# Patient Record
Sex: Male | Born: 1950 | Race: White | Hispanic: No | Smoking: Former smoker
Health system: Southern US, Community
[De-identification: ages and names within clinical notes are randomized; demographics above are authoritative.]

## PROBLEM LIST (undated history)

## (undated) DIAGNOSIS — M545 Low back pain, unspecified: Secondary | ICD-10-CM

## (undated) DIAGNOSIS — J309 Allergic rhinitis, unspecified: Secondary | ICD-10-CM

## (undated) DIAGNOSIS — M8580 Other specified disorders of bone density and structure, unspecified site: Secondary | ICD-10-CM

## (undated) DIAGNOSIS — I10 Essential (primary) hypertension: Secondary | ICD-10-CM

## (undated) DIAGNOSIS — Z8249 Family history of ischemic heart disease and other diseases of the circulatory system: Principal | ICD-10-CM

## (undated) HISTORY — DX: Low back pain: M54.5

## (undated) HISTORY — DX: Other specified disorders of bone density and structure, unspecified site: M85.80

## (undated) HISTORY — PX: APPENDECTOMY: SHX54

## (undated) HISTORY — DX: Allergic rhinitis, unspecified: J30.9

## (undated) HISTORY — DX: Family history of ischemic heart disease and other diseases of the circulatory system: Z82.49

## (undated) HISTORY — PX: ANTERIOR FUSION CERVICAL SPINE: SUR626

## (undated) HISTORY — DX: Low back pain, unspecified: M54.50

## (undated) HISTORY — PX: TONSILLECTOMY: SUR1361

## (undated) HISTORY — PX: COLONOSCOPY: SHX174

## (undated) HISTORY — DX: Essential (primary) hypertension: I10

---

## 2002-05-28 ENCOUNTER — Encounter: Payer: Self-pay | Admitting: Neurosurgery

## 2002-05-28 ENCOUNTER — Encounter: Payer: Self-pay | Admitting: General Surgery

## 2002-05-28 ENCOUNTER — Inpatient Hospital Stay (HOSPITAL_COMMUNITY): Admission: EM | Admit: 2002-05-28 | Discharge: 2002-06-01 | Payer: Self-pay

## 2002-05-29 ENCOUNTER — Encounter: Payer: Self-pay | Admitting: General Surgery

## 2002-06-01 ENCOUNTER — Inpatient Hospital Stay (HOSPITAL_COMMUNITY)
Admission: AD | Admit: 2002-06-01 | Discharge: 2002-06-22 | Payer: Self-pay | Admitting: Physical Medicine & Rehabilitation

## 2002-06-26 ENCOUNTER — Encounter
Admission: RE | Admit: 2002-06-26 | Discharge: 2002-09-24 | Payer: Self-pay | Admitting: Physical Medicine & Rehabilitation

## 2002-09-25 ENCOUNTER — Encounter
Admission: RE | Admit: 2002-09-25 | Discharge: 2002-12-24 | Payer: Self-pay | Admitting: Physical Medicine & Rehabilitation

## 2003-10-07 ENCOUNTER — Encounter: Payer: Self-pay | Admitting: Family Medicine

## 2003-10-07 ENCOUNTER — Encounter: Admission: RE | Admit: 2003-10-07 | Discharge: 2003-10-07 | Payer: Self-pay | Admitting: Family Medicine

## 2003-12-13 ENCOUNTER — Encounter: Admission: RE | Admit: 2003-12-13 | Discharge: 2003-12-13 | Payer: Self-pay | Admitting: Family Medicine

## 2004-06-10 IMAGING — RF DG BE W/ AIR HIGH DENSITY
8 series · 8 of 8 positions shown · non-contrast
Comparison: none

CLINICAL DATA: Incomplete flexible sigmoidoscopy. 
 KUB
 In view of today?s sigmoidoscopy, erect view of the abdomen was obtained.  No free air is seen.  The bowel gas pattern is non specific.  
 IMPRESSION
 No free air. 
 HIGH DENSITY BE W/AIR
 A KUB prior to barium enema with air shows some air throughout both large and small bowel gas from this mornings flexible sigmoidoscopy.  No bowel distention is seen. 
 Double contrast barium enema was performed.  There is some retained feces particularly in the right colon.  However no persistent polypoid lesion or constricting lesion is seen.  Reflux into the terminal ileum shows no significant abnormality. 
 Some retained feces but no persistent polypoid lesion or constricting lesion.  Reflux into the terminal ileum shows no abnormality.

[Series 2: run · 1 of 1 slices shown (1 of 8)]
[im 1/1]
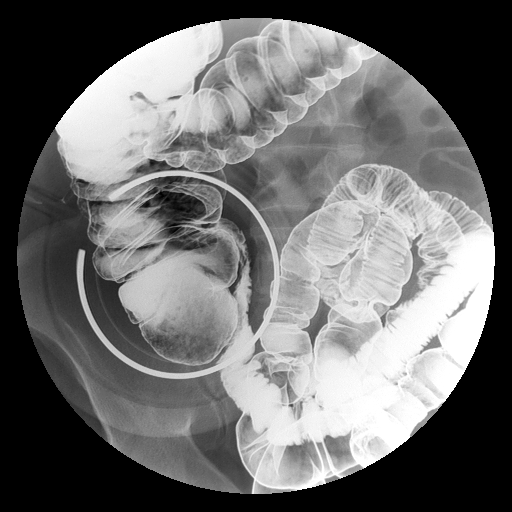

[Series 3: run · 1 of 1 slices shown (2 of 8)]
[im 1/1]
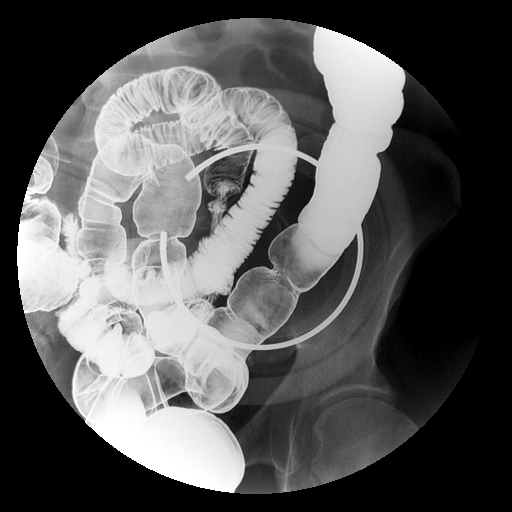

[Series 4: run · 1 of 1 slices shown (3 of 8)]
[im 1/1]
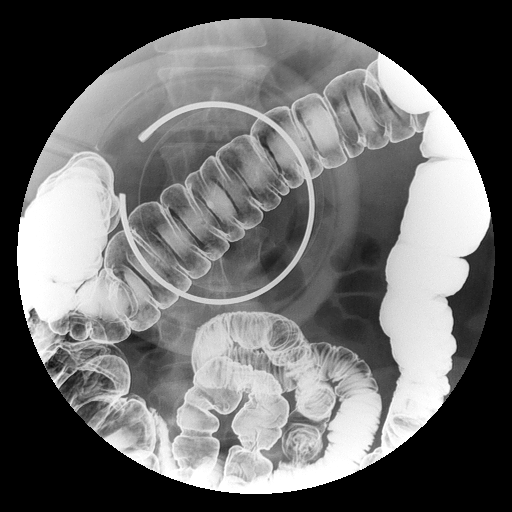

[Series 5: run · 1 of 1 slices shown (4 of 8)]
[im 1/1]
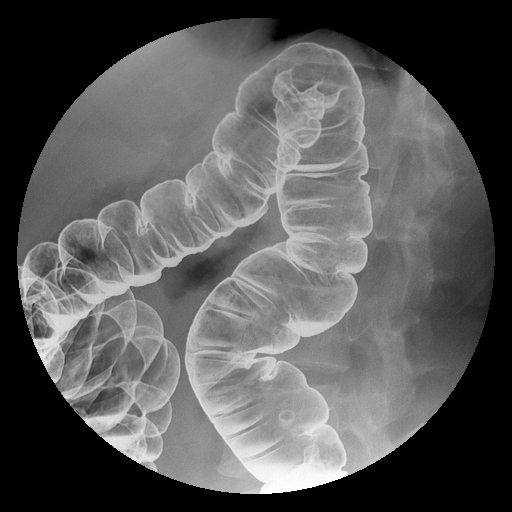

[Series 6: run · 1 of 1 slices shown (5 of 8)]
[im 1/1]
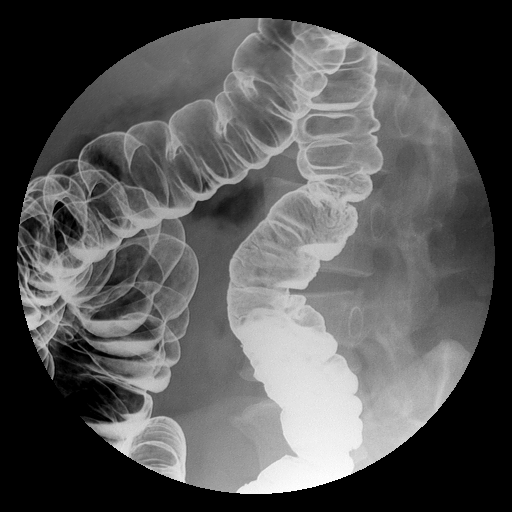

[Series 7: run · 1 of 1 slices shown (6 of 8)]
[im 1/1]
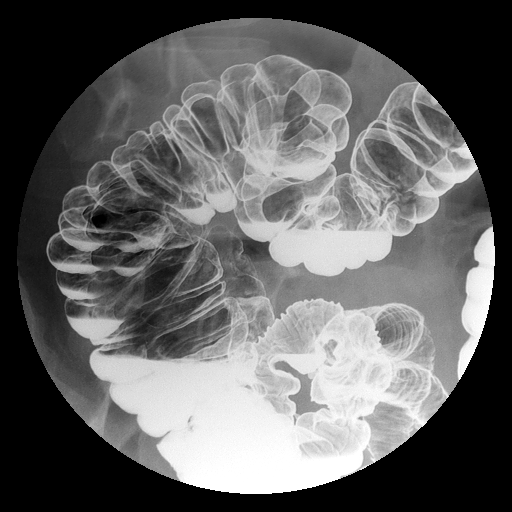

[Series 8: run · 1 of 1 slices shown (7 of 8)]
[im 1/1]
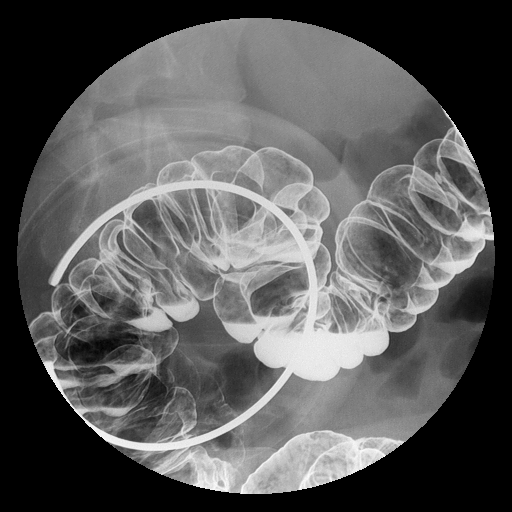

[Series 9: run · 1 of 1 slices shown (8 of 8)]
[im 1/1]
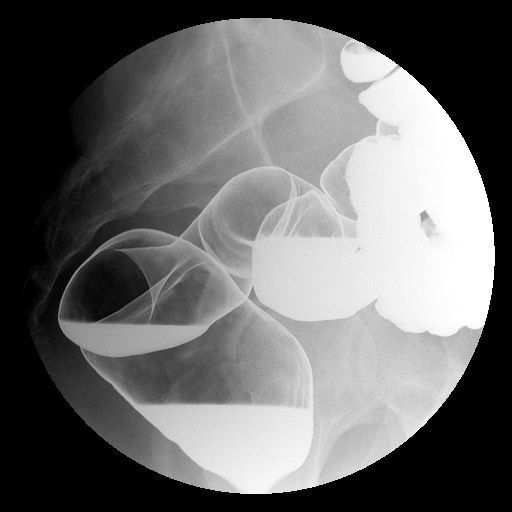

[8 of 8 positions shown; findings below may reference images not displayed]

## 2011-01-16 ENCOUNTER — Encounter: Payer: Self-pay | Admitting: Family Medicine

## 2015-01-30 ENCOUNTER — Encounter: Payer: Self-pay | Admitting: *Deleted

## 2015-01-31 ENCOUNTER — Ambulatory Visit (INDEPENDENT_AMBULATORY_CARE_PROVIDER_SITE_OTHER): Payer: PRIVATE HEALTH INSURANCE | Admitting: Cardiology

## 2015-01-31 ENCOUNTER — Encounter: Payer: Self-pay | Admitting: Cardiology

## 2015-01-31 VITALS — BP 144/100 | HR 59 | Ht 69.0 in | Wt 207.8 lb

## 2015-01-31 DIAGNOSIS — Z8249 Family history of ischemic heart disease and other diseases of the circulatory system: Secondary | ICD-10-CM

## 2015-01-31 HISTORY — DX: Family history of ischemic heart disease and other diseases of the circulatory system: Z82.49

## 2015-01-31 NOTE — Progress Notes (Signed)
Cardiology Office Note   Date:  01/31/2015   ID:  Allen Hardy Bolger, DOB [birth year], MRN [id number]  PCP:  Allean FoundSMITH,CANDACE THIELE, MD  Cardiologist:   Quintella ReichertURNER,TRACI R, MD   Chief Complaint  Patient presents with  . family history of CAD      History of Present Illness: Allen Hardy Goyal is a 64 y.o. male who presents for evaluation of family history of CAD.  He has a history of HTN and has been on ACE I therapy and BP has normalized.  He exercises 30 minutes 3 times weekly.  He has a family history of CAD with his brother having an MI at age 763.  His father died at 6263 of an MI and was a heavy smoker and drinker.  He is now referred for evaluation of CAD.   He denies any chest pain or pressure, SOB, DOE, PND, orthopnea, LE edema, dizziness ( unless he stands up too fast), palpitations or syncope.      Past Medical History  Diagnosis Date  . Low back pain   . Rhinitis, allergic   . Hypertension, benign   . Osteopenia     Past Surgical History  Procedure Laterality Date  . Appendectomy    . Tonsillectomy    . Anterior fusion cervical spine    . Colonoscopy       Current Outpatient Prescriptions  Medication Sig Dispense Refill  . aspirin 81 MG tablet Take 81 mg by mouth daily.    Marland Kitchen. lisinopril (PRINIVIL,ZESTRIL) 10 MG tablet Take 10 mg by mouth daily.    . Misc Natural Products (APPLE CIDER VINEGAR DIET PO) Take by mouth.    . Multiple Vitamins-Minerals (MULTIVITAL PO) Take 1 capsule by mouth daily.    . vitamin Hardy 1000 UNIT capsule Take 1,000 Units by mouth daily.     No current facility-administered medications for this visit.    Allergies:   Review of patient's allergies indicates no known allergies.    Social History:  The patient  reports that he has quit smoking. He does not have any smokeless tobacco history on file. He reports that he drinks alcohol. He reports that he does not use illicit drugs.   Family History:  The patient's family history includes Asthma in  his son; COPD in his sister; Cancer in his mother; Coronary artery disease in his brother; Diabetes in his daughter and mother; Hyperlipidemia in his father; Hypertension in his father; Stroke in his sister.    ROS:  Please see the history of present illness.   Otherwise, review of systems are positive for none.   All other systems are reviewed and negative.    PHYSICAL EXAM: VS:  BP 144/100 mmHg  Pulse 59  Ht 5\' 9"  (1.753 m)  Wt 207 lb 12.8 oz (94.257 kg)  BMI 30.67 kg/m2 , BMI Body mass index is 30.67 kg/(m^2). GEN: Well nourished, well developed, in no acute distress HEENT: normal Neck: no JVD, carotid bruits, or masses Cardiac: RRR; no murmurs, rubs, or gallops,no edema  Respiratory:  clear to auscultation bilaterally, normal work of breathing GI: soft, nontender, nondistended, + BS MS: no deformity or atrophy Skin: warm and dry, no rash Neuro:  Strength and sensation are intact Psych: euthymic mood, full affect   EKG:  EKG is ordered today. The ekg ordered today demonstrates sinus bradycardia at 59bpm with isolated T wave inversion in aVL.     Recent Labs: No results found for requested labs within last  365 days.    Lipid Panel No results found for: CHOL, TRIG, HDL, CHOLHDL, VLDL, LDLCALC, LDLDIRECT    Wt Readings from Last 3 Encounters:  01/31/15 207 lb 12.8 oz (94.257 kg)      ASSESSMENT AND PLAN:  1.  Family history of CAD - he is completely asymptomatic and exercises 3 times weekly with no problems.  His CRF include male sex, age >29, family history of CAD at early age and HTN.     Current medicines are reviewed at length with the patient today.  The patient does not have concerns regarding medicines.  The following changes have been made:  no change  Labs/ tests ordered today include: ETT     Disposition:   FU with me in 1 year  Signed, Quintella Reichert, MD  01/31/2015 2:10 PM    [facility] [address] [place], [place], [place]   [address] Phone: [phone]; Fax: [phone]

## 2015-01-31 NOTE — Patient Instructions (Signed)
Your physician has requested that you have an exercise tolerance test. For further information please visit [email or web address]. Please also follow instruction sheet, as given.  Your physician wants you to follow-up in: 1 year with Dr. Turner. You will receive a reminder letter in the mail two months in advance. If you don't receive a letter, please call our office to schedule the follow-up appointment. 

## 2015-04-02 ENCOUNTER — Telehealth (HOSPITAL_COMMUNITY): Payer: Self-pay

## 2015-04-04 ENCOUNTER — Ambulatory Visit (HOSPITAL_COMMUNITY)
Admission: RE | Admit: 2015-04-04 | Discharge: 2015-04-04 | Disposition: A | Discharge status: Home / Self Care | Payer: PRIVATE HEALTH INSURANCE | Source: Ambulatory Visit | Attending: Cardiology | Admitting: Cardiology

## 2015-04-04 DIAGNOSIS — Z8249 Family history of ischemic heart disease and other diseases of the circulatory system: Secondary | ICD-10-CM | POA: Insufficient documentation

## 2015-04-04 NOTE — Procedures (Signed)
Exercise Treadmill Test  Test  Exercise Tolerance Test Ordering MD: Armanda Magicraci Turner, MD    Unique Test No: 1  Treadmill:  1  Indication for ETT: Family History  Contraindication to ETT: No   Stress Modality: exercise - treadmill  Cardiac Imaging Performed: non   Protocol: standard Bruce - maximal  Max BP:  178/81  Max MPHR (bpm):  158 85% MPR (bpm):  134  MPHR obtained (bpm):  151 % MPHR obtained:  95  Reached 85% MPHR (min:sec):  9:30 Total Exercise Time (min-sec):  12  Workload in METS:  13.4 Borg Scale: 15  Reason ETT Terminated:  fatigue    ST Segment Analysis At Rest: normal ST segments - no evidence of significant ST depression With Exercise: no evidence of significant ST depression  Other Information Arrhythmia:  No Angina during ETT:  absent (0) Quality of ETT:  diagnostic  ETT Interpretation:  normal - no evidence of ischemia by ST analysis  Comments: The patient had an excellent exercise tolerance.  There was no chest pain.  There was an appropriate level of dyspnea.  There were no arrhythmias, a normal heart rate response and normal BP response.  There were no ischemic ST T wave changes and a normal heart rate recovery.  Recommendations: Plan per Dr. Mayford Knifeurner.

## 2015-04-07 ENCOUNTER — Encounter: Payer: Self-pay | Admitting: Cardiology

## 2015-04-07 NOTE — Telephone Encounter (Signed)
This encounter was created in error - please disregard.

## 2015-04-07 NOTE — Telephone Encounter (Signed)
New message ° ° °Patient returning call back to nurse.  °

## 2015-04-08 NOTE — Telephone Encounter (Signed)
Encounter complete.
